# Patient Record
Sex: Male | Born: 1995
Health system: Southern US, Community
[De-identification: ages and names within clinical notes are randomized; demographics above are authoritative.]

---
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
## 2015-06-09 DIAGNOSIS — Z0000 Encounter for general adult medical examination without abnormal findings: Secondary | ICD-10-CM | POA: Diagnosis not present

## 2015-06-21 DIAGNOSIS — D729 Disorder of white blood cells, unspecified: Secondary | ICD-10-CM | POA: Diagnosis not present

## 2015-07-12 MED FILL — CHLORHEXIDINE 0.12% RINSE: 0.12 | 11 days supply | Qty: 473 | Fill #0

## 2015-09-27 ENCOUNTER — Encounter: Payer: Self-pay | Admitting: Allergy and Immunology

## 2015-09-27 ENCOUNTER — Ambulatory Visit (INDEPENDENT_AMBULATORY_CARE_PROVIDER_SITE_OTHER): Payer: 59 | Admitting: Allergy and Immunology

## 2015-09-27 DIAGNOSIS — Z889 Allergy status to unspecified drugs, medicaments and biological substances status: Secondary | ICD-10-CM | POA: Diagnosis not present

## 2015-09-27 MED ORDER — AMOXICILLIN 250 MG/5ML PO SUSR: 250.0000 mg | Freq: Three times a day (TID) | ORAL | Status: AC

## 2015-09-27 NOTE — Patient Instructions (Signed)
History of drug allergy Epicutaneous and intradermal skin tests to penicillin G and Pre-Pen were negative today at all dilutions despite a positive histamine control.  Than will return next week for open graded challenge to amoxicillin to definitively rule out the drug allergy.   Return in about 1 week (around 10/04/2015) for amoxicillin challenge.

## 2015-09-27 NOTE — Progress Notes (Signed)
New Patient Note  RE: John Wheeler MRN: [id number] DOB: [birth year] Date of Office Visit: 09/27/2015  Referring provider: Renford Dills, MD Primary care provider: Katy Apo, MD  Chief Complaint: Other (rule out PCN allergy)   History of present illness: John Wheeler is a 20 y.o. male presenting today to rule out penicillin allergy.  The patient is applying to [employer] but, based upon his mother's and sister's documented allergy to penicillin, he has been sent to [place] to rule out penicillin allergy.  His mother is not believe that he has had penicillin previously.  He has no other known drug allergies.    Assessment and plan: History of drug allergy Epicutaneous and intradermal skin tests to penicillin G and Pre-Pen were negative today at all dilutions despite a positive histamine control.  John Wheeler will return next week for open graded challenge to amoxicillin to definitively rule out the drug allergy.   Meds ordered this encounter  Medications  . amoxicillin (AMOXIL) 250 MG/5ML suspension 250 mg    USE JUST FOR DRUG ALLERGY TESTING    Diagnositics: Allergy skin testing: Negative at all dilutions to Pre-Pen and penicillin G despite a positive histamine control.    Physical examination: Blood pressure 106/60, pulse 60, temperature 98 F (36.7 C), temperature source Oral, resp. rate 16, height 5' 9.09" (1.755 m), weight 147 lb 0.8 oz (66.7 kg).  General: Alert, interactive, in no acute distress. Neck: Supple without lymphadenopathy. Lungs: Clear to auscultation without wheezing, rhonchi or rales. CV: Normal S1, S2 without murmurs. Abdomen: Nondistended, nontender. Skin: Warm and dry, without lesions or rashes. Extremities:  No clubbing, cyanosis or edema. Neuro:   Grossly intact.  Review of systems:  Review of Systems  Constitutional: Negative for chills, fever and weight loss.  HENT: Negative for nosebleeds.   Eyes: Negative for blurred vision.    Respiratory: Negative for hemoptysis.   Cardiovascular: Negative for chest pain.  Gastrointestinal: Negative for constipation and diarrhea.  Genitourinary: Negative for dysuria.  Musculoskeletal: Negative for joint pain and myalgias.  Neurological: Negative for dizziness.  Endo/Heme/Allergies: Does not bruise/bleed easily.    Past medical history:  History reviewed. No pertinent past medical history.  Past surgical history:  Past Surgical History:  Procedure Laterality Date  . APPENDECTOMY  2008    Family history: Family History  Problem Relation Age of Onset  . Allergic rhinitis Neg Hx   . Angioedema Neg Hx   . Asthma Neg Hx   . Eczema Neg Hx   . Immunodeficiency Neg Hx   . Urticaria Neg Hx     Social history: Social History   Social History  . Marital status: Single    Spouse name: N/A  . Number of children: N/A  . Years of education: N/A   Occupational History  . Not on file.   Social History Main Topics  . Smoking status: Never Smoker  . Smokeless tobacco: Never Used  . Alcohol use No  . Drug use: No  . Sexual activity: Not on file   Other Topics Concern  . Not on file   Social History Narrative  . No narrative on file   Environmental History: The patient lives in a 22 year old house with carpeting in the bedroom and central air/heat.  There is a dog in house which does not have access to his bedroom.  He is a nonsmoker.    Medication List    as of 09/27/2015  1:36 PM   You  have not been prescribed any medications.     Known medication allergies: Allergies  Allergen Reactions  . Penicillins     I appreciate the opportunity to take part in Jontay's care. Please do not hesitate to contact me with questions.  Sincerely,   R. Jorene Guest, MD

## 2015-09-27 NOTE — Assessment & Plan Note (Addendum)
Epicutaneous and intradermal skin tests to penicillin G and Pre-Pen were negative today at all dilutions despite a positive histamine control.  John Wheeler will return next week for open graded challenge to amoxicillin to definitively rule out the drug allergy.

## 2015-09-28 ENCOUNTER — Telehealth: Payer: Self-pay

## 2015-09-28 MED FILL — AMOXICILLIN 250 MG/5 ML SUS: 250 | 1 days supply | Qty: 100 | Fill #0

## 2015-09-28 NOTE — Telephone Encounter (Signed)
Mom called regarding (Amoxil) drug challenge.  Amoxil was not sent to pharmacy.  I called in Amoxil 250mg /79ml ( ) to use for in office drug challenge tomorrow with Dr. Dellis Anes in [facility].  Mom informed and she will pick it up in the morning.

## 2015-09-29 ENCOUNTER — Encounter: Payer: Self-pay | Admitting: Allergy & Immunology

## 2015-09-29 ENCOUNTER — Ambulatory Visit (INDEPENDENT_AMBULATORY_CARE_PROVIDER_SITE_OTHER): Payer: 59 | Admitting: Allergy & Immunology

## 2015-09-29 VITALS — BP 84/60 | HR 56 | Temp 97.7°F | Resp 20

## 2015-09-29 DIAGNOSIS — Z889 Allergy status to unspecified drugs, medicaments and biological substances status: Secondary | ICD-10-CM | POA: Diagnosis not present

## 2015-09-29 NOTE — Patient Instructions (Addendum)
1. You tolerated the amoxicillin dose, therefore you are officially not allergic to penicillins.  2. We have removed this from your allergy list.  3. We have printed off a letter stating that you are not allergic to penicillins.  4. For the next two days, take 57mL three time daily. If you are doing well at that point, you may stop. This is just an added precaution to make sure that you do not have any late reactions to the medication.   It was a pleasure meeting you today! Call us with any concerning symptoms you may have throughout the day, including swelling, hives, stomach pain, or anything other symptoms.

## 2015-09-29 NOTE — Progress Notes (Signed)
    John Wheeler presents today for an open challenge to penicillin. He was evaluated two days prior and underwent skin testing that was negative (see Office Note by Dr. Nunzio Cobbs). Since the visit, he reports that he has done well. He did work out after the visit and felt somewhat lightheaded, but otherwise no wheezing, pruritis, SOB, abdominal pain, urticaria, or other adverse results from the testing. Of note, he has never been exposed to penicillin and instead carries the diagnosis as a precaution due to penicillin allergies in his mother and sister. Today he brings in amoxicillin liquid (250mg /48mL).   Vitals:   09/29/15 0908  BP: (!) 84/60  Pulse: (!) 56  Resp: 20  Temp: 97.7 F (36.5 C)   **BP was confirmed three times (this was prior to the first dose)   General:  alert, active, in no acute distress, very pleasant male Head:  normocephalic, no masses, lesions, tenderness or abnormalities Eyes:  conjunctiva clear without injection or discharge, EOMI, PERL Ears:  TM's pearly white bilaterally, external auditory canals are clear, external ears are normally set and rotated Nose:  External nose within normal limits, normal appearing turbinates, no olored discharge, septum midline Throat:  moist mucous membranes without erythema, exudates or petechiae, uvula midline Lungs:  clear to auscultation, no wheezing, crackles or rhonchi, breathing unlabored, moving air well in all lung fields, moving air well in all lung fields, no retractions Heart:  regular rate and rhythm, normal S1/S2, no murmurs or gallops, normal peripheral perfusion Abdomen:  Soft, non-tender, BS normal, no masses, no organomegaly Neuro:  Normal mental status, speech normal, alert and oriented x3 Skin:  skin color, texture and turgor are normal; no bruising, rashes or lesions noted.  We will perform a 10/90 challenge today. Full dose of amoxicillin is 500mg  (58mL), therefore 10% of the dose is 39mL of thr 250mg /60mL). We  will give 54mL and wait 30 minutes. Then we will give 26mL and wait another 30 minutes. If he remains stable, we will discharge.  Vitals remained stable during the challenge (see Flowsheets). His exam did not change and he displayed no symptoms of anaphylaxis during his challenge.   Open graded penicillin challenge: The patient was able to tolerate the challenge today without adverse signs or symptoms. Vital signs were stable throughout the challenge and observation period. He was instructed to take 76mL of amoxicillin three times daily for the next two days to make sure that he did not have any delayed reactions.   The patient had negative skin tests to penicillin and was able to tolerate the open graded oral challenge today without adverse signs or symptoms. Therefore, he has the same risk of systemic reaction associated with the consumption of penicillin as the general population. A letter explaining the testing and the results was provided so that he could pass it along to [employer] during his recruitment physical.   John Wheeler was discharged with strict return precautions and indications for seeking immediate medical help. He does have access to the after-hours on-call physician.   John Bonds, MD FAAAAI [facility]

## 2015-10-06 ENCOUNTER — Telehealth: Payer: Self-pay | Admitting: Allergy & Immunology

## 2015-10-06 NOTE — Telephone Encounter (Signed)
Patient called for test results and said that this can be given over the phone to his mother at [phone] because he will be going to work this afternoon. Please call pt mom back with test results. Thanks

## 2015-10-06 NOTE — Telephone Encounter (Signed)
Left message on mom's cell # ok'd by the pt. That his penicillin skin testing and amoxicillin oral challenge was all negative and penicillin drug allergy was removed.

## 2015-10-09 ENCOUNTER — Ambulatory Visit: Payer: Self-pay | Admitting: Allergy and Immunology

## 2015-10-12 ENCOUNTER — Telehealth: Payer: Self-pay | Admitting: Allergy & Immunology

## 2015-10-13 NOTE — Telephone Encounter (Signed)
LM FOR PT TO CALL US BACK I HAVE PRINTED OFF RESULTS AND PLACED THEM UPFRONT.

## 2016-04-18 DIAGNOSIS — R41840 Attention and concentration deficit: Secondary | ICD-10-CM | POA: Diagnosis not present

## 2016-04-18 DIAGNOSIS — E559 Vitamin D deficiency, unspecified: Secondary | ICD-10-CM | POA: Diagnosis not present

## 2016-04-18 DIAGNOSIS — M8430XA Stress fracture, unspecified site, initial encounter for fracture: Secondary | ICD-10-CM | POA: Diagnosis not present

## 2016-10-02 ENCOUNTER — Encounter (HOSPITAL_BASED_OUTPATIENT_CLINIC_OR_DEPARTMENT_OTHER): Payer: Self-pay | Admitting: *Deleted

## 2016-10-02 ENCOUNTER — Emergency Department (HOSPITAL_BASED_OUTPATIENT_CLINIC_OR_DEPARTMENT_OTHER): Payer: 59

## 2016-10-02 ENCOUNTER — Emergency Department (HOSPITAL_BASED_OUTPATIENT_CLINIC_OR_DEPARTMENT_OTHER)
Admission: EM | Admit: 2016-10-02 | Discharge: 2016-10-02 | Disposition: A | Discharge status: Home / Self Care | Payer: 59 | Attending: Physician Assistant | Admitting: Physician Assistant

## 2016-10-02 DIAGNOSIS — M545 Low back pain: Secondary | ICD-10-CM | POA: Diagnosis not present

## 2016-10-02 MED ORDER — CYCLOBENZAPRINE HCL 10 MG PO TABS: 10.0000 mg | ORAL_TABLET | Freq: Two times a day (BID) | ORAL | 0 refills | Status: DC | PRN

## 2016-10-02 MED ORDER — IBUPROFEN 800 MG PO TABS: 800.0000 mg | ORAL_TABLET | Freq: Three times a day (TID) | ORAL | 0 refills | Status: DC

## 2016-10-02 NOTE — ED Notes (Signed)
Patient transported to X-ray 

## 2016-10-02 NOTE — Discharge Instructions (Signed)
Please use ibuprofen and flexeril to help your symptoms.  We think that your symptoms are likely musculoskeletal in nature. Please follow-up with her primary care physician.
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
## 2016-10-03 MED FILL — IBUPROFEN 800 MG TAB: 800 | 7 days supply | Qty: 21 | Fill #0

## 2016-10-03 MED FILL — CYCLOBENZAPRINE 10 MG TABLE: 10 | 5 days supply | Qty: 10 | Fill #0

## 2017-11-14 ENCOUNTER — Ambulatory Visit (HOSPITAL_COMMUNITY)
Admission: EM | Admit: 2017-11-14 | Discharge: 2017-11-14 | Disposition: A | Discharge status: Home / Self Care | Payer: 59 | Attending: Emergency Medicine | Admitting: Emergency Medicine

## 2017-11-14 ENCOUNTER — Encounter (HOSPITAL_COMMUNITY): Payer: Self-pay | Admitting: Emergency Medicine

## 2017-11-14 DIAGNOSIS — S0502XA Injury of conjunctiva and corneal abrasion without foreign body, left eye, initial encounter: Secondary | ICD-10-CM

## 2017-11-14 MED ORDER — OFLOXACIN 0.3 % OP SOLN: OPHTHALMIC | 0 refills | Status: DC

## 2017-11-14 MED FILL — OFLOXACIN 0.3% EYE DROPS: 0.3 | 8 days supply | Qty: 10 | Fill #0

## 2017-11-14 NOTE — ED Triage Notes (Signed)
Pt sts left eye irritation after something got into eye last night; pt still having pain and irritation

## 2017-11-14 NOTE — ED Provider Notes (Signed)
[facility]   [id number] 11/14/17 Arrival Time: [id number]  CC:EYE IRRITATION  SUBJECTIVE:  John Wheeler is a 22 y.o. male who presents with complaint of eye discomfort and irritation that began last night.  Speculates dust from a wood pallet may have gotten into his eye while at work.  Has tried washing eye out and using eye drops without relief.  Symptoms are made worse with keeping eye open for extended periods of time.  Complains of mild itching and FB sensation.  Denies eye pain, discharge, itching, vision changes, double vision.  Denies contact lens use.    ROS: As per HPI.  History reviewed. No pertinent past medical history. Past Surgical History:  Procedure Laterality Date  . APPENDECTOMY  2008   No Known Allergies Current Facility-Administered Medications on File Prior to Encounter  Medication Dose Route Frequency Provider Last Rate Last Dose  . amoxicillin (AMOXIL) 250 MG/5ML suspension 250 mg  250 mg Oral Q8H Bobbitt, Heywood Ilesalph Carter, MD       Current Outpatient Medications on File Prior to Encounter  Medication Sig Dispense Refill  . ibuprofen (ADVIL,MOTRIN) 800 MG tablet Take 1 tablet (800 mg total) by mouth 3 (three) times daily. 21 tablet 0   Social History   Socioeconomic History  . Marital status: Single    Spouse name: Not on file  . Number of children: Not on file  . Years of education: Not on file  . Highest education level: Not on file  Occupational History  . Not on file  Social Needs  . Financial resource strain: Not on file  . Food insecurity:    Worry: Not on file    Inability: Not on file  . Transportation needs:    Medical: Not on file    Non-medical: Not on file  Tobacco Use  . Smoking status: Never Smoker  . Smokeless tobacco: Never Used  Substance and Sexual Activity  . Alcohol use: No  . Drug use: No  . Sexual activity: Not on file  Lifestyle  . Physical activity:    Days per week: Not on file    Minutes per session: Not on  file  . Stress: Not on file  Relationships  . Social connections:    Talks on phone: Not on file    Gets together: Not on file    Attends religious service: Not on file    Active member of club or organization: Not on file    Attends meetings of clubs or organizations: Not on file    Relationship status: Not on file  . Intimate partner violence:    Fear of current or ex partner: Not on file    Emotionally abused: Not on file    Physically abused: Not on file    Forced sexual activity: Not on file  Other Topics Concern  . Not on file  Social History Narrative  . Not on file   Family History  Problem Relation Age of Onset  . Allergic rhinitis Neg Hx   . Angioedema Neg Hx   . Asthma Neg Hx   . Eczema Neg Hx   . Immunodeficiency Neg Hx   . Urticaria Neg Hx     OBJECTIVE:    Visual Acuity  Right Eye Distance: 20/20 without corrective lens Left Eye Distance: 20/20 without corrective lens Bilateral Distance: 20/20 without corrective lens  Right Eye Near:   Left Eye Near:    Bilateral Near:      Vitals:  11/14/17 1021  BP: 125/69  Pulse: 75  Resp: 18  Temp: 97.7 F (36.5 C)  TempSrc: Oral  SpO2: 100%    General appearance: alert; no distress Eyes:  Mild-moderate conjunctival injection. PERRL; EOMI without discomfort;  no obvious drainage; lid everted without obvious FB; diffuse fluorescein uptake, most prominent in 3 o'clock position Neck: supple Lungs: clear to auscultation bilaterally Heart: regular rate and rhythm Skin: warm and dry Psychological: alert and cooperative; normal mood and affect   Orders Placed This Encounter  Procedures  . Eye care    Please irrigate left eye    Standing Status:   Standing    Number of Occurrences:   1    ASSESSMENT & PLAN:  1. Abrasion of left cornea, initial encounter     Meds ordered this encounter  Medications  . ofloxacin (OCUFLOX) 0.3 % ophthalmic solution    Sig: instill 1 or 2 drops in affected eye(s)  every 2 to 4 hours for 2 days, then 1 to 2 drops 4 times daily on days 3 through 7    Dispense:  5 mL    Refill:  0    Order Specific Question:   Supervising Provider    Answer:   Isa Rankin [[id number]]   Eye irrigated Use ofloxacin as prescribed and to completion Use OTC systane or genteal gel eye drops at night as needed for symptomatic relief Use OTC ibuprofen or tylenol as needed for pain relief Return here or follow up with ophthamolgy if symptoms persists or worsen.    Instructed patient to follow up with ophthamology if symptoms did not continue to improve or if he experience any worsening or new symptoms such as vision changes, increased pain, increased redness, discharge, etc...  Reviewed expectations re: course of current medical issues. Questions answered. Outlined signs and symptoms indicating need for more acute intervention. Patient verbalized understanding. After Visit Summary given.   Rennis Harding, PA-C 11/14/17 1137

## 2017-11-14 NOTE — Discharge Instructions (Addendum)
Use ofloxacin as prescribed and to completion °Use OTC systane or genteal gel eye drops at night as needed for symptomatic relief °Use OTC ibuprofen or tylenol as needed for pain relief °Return here or follow up with ophthamolgy if symptoms persists or worsen  °
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
## 2018-12-03 ENCOUNTER — Ambulatory Visit: Payer: 59 | Admitting: Family Medicine

## 2019-01-14 ENCOUNTER — Encounter: Payer: Self-pay | Admitting: Family Medicine

## 2019-01-14 ENCOUNTER — Ambulatory Visit: Payer: 59 | Admitting: Family Medicine

## 2019-01-14 ENCOUNTER — Other Ambulatory Visit: Payer: Self-pay

## 2019-01-14 VITALS — BP 102/60 | HR 64 | Temp 97.6°F | Ht 70.5 in | Wt 170.6 lb

## 2019-01-14 DIAGNOSIS — F339 Major depressive disorder, recurrent, unspecified: Secondary | ICD-10-CM | POA: Diagnosis not present

## 2019-01-14 DIAGNOSIS — Z8781 Personal history of (healed) traumatic fracture: Secondary | ICD-10-CM

## 2019-01-14 DIAGNOSIS — F8081 Childhood onset fluency disorder: Secondary | ICD-10-CM | POA: Diagnosis not present

## 2019-01-14 DIAGNOSIS — G4700 Insomnia, unspecified: Secondary | ICD-10-CM

## 2019-01-14 DIAGNOSIS — K219 Gastro-esophageal reflux disease without esophagitis: Secondary | ICD-10-CM

## 2019-01-14 DIAGNOSIS — Z7689 Persons encountering health services in other specified circumstances: Secondary | ICD-10-CM | POA: Diagnosis not present

## 2019-01-14 LAB — BASIC METABOLIC PANEL
BUN: 23 mg/dL (ref 6–23)
CO2: 28 mEq/L (ref 19–32)
Calcium: 9.6 mg/dL (ref 8.4–10.5)
Chloride: 103 mEq/L (ref 96–112)
Creatinine, Ser: 0.78 mg/dL (ref 0.40–1.50)
GFR: 145.75 mL/min (ref >60.00)
Glucose, Bld: 89 mg/dL (ref 70–99)
Potassium: 4.6 mEq/L (ref 3.5–5.1)
Sodium: 137 mEq/L (ref 135–145)

## 2019-01-14 LAB — T4, FREE: Free T4: 0.72 ng/dL (ref 0.60–1.60)

## 2019-01-14 LAB — CBC
HCT: 41.3 % (ref 39.0–52.0)
Hemoglobin: 13.7 g/dL (ref 13.0–17.0)
MCHC: 33.1 g/dL (ref 30.0–36.0)
MCV: 84.6 fl (ref 78.0–100.0)
Platelets: 248 10*3/uL (ref 150.0–400.0)
RBC: 4.88 Mil/uL (ref 4.22–5.81)
RDW: 14.3 % (ref 11.5–15.5)
WBC: 3.9 10*3/uL — ABNORMAL LOW (ref 4.0–10.5)

## 2019-01-14 LAB — TSH: TSH: 2.23 u[IU]/mL (ref 0.35–4.50)

## 2019-01-14 MED ORDER — OMEPRAZOLE 20 MG PO CPDR: 20.0000 mg | DELAYED_RELEASE_CAPSULE | Freq: Every day | ORAL | 3 refills | Status: DC

## 2019-01-14 MED ORDER — HYDROXYZINE PAMOATE 50 MG PO CAPS: 50.0000 mg | ORAL_CAPSULE | Freq: Every evening | ORAL | 0 refills | Status: DC | PRN

## 2019-01-14 MED FILL — HYDROXYZINE PAM 50 MG CAP: 50 | 30 days supply | Qty: 30 | Fill #0

## 2019-01-14 MED FILL — OMEPRAZOLE 20 MG CAP: 20 | 30 days supply | Qty: 30 | Fill #0

## 2019-01-14 NOTE — Patient Instructions (Addendum)
You should contact the [facility] for counseling.  [facility]com Food Choices for Gastroesophageal Reflux Disease, Adult When you have gastroesophageal reflux disease (GERD), the foods you eat and your eating habits are very important. Choosing the right foods can help ease your discomfort. Think about working with a nutrition specialist (dietitian) to help you make good choices. What are tips for following this plan?  Meals  Choose healthy foods that are low in fat, such as fruits, vegetables, whole grains, low-fat dairy products, and lean meat, fish, and poultry.  Eat small meals often instead of 3 large meals a day. Eat your meals slowly, and in a place where you are relaxed. Avoid bending over or lying down until 2-3 hours after eating.  Avoid eating meals 2-3 hours before bed.  Avoid drinking a lot of liquid with meals.  Cook foods using methods other than frying. Bake, grill, or broil food instead.  Avoid or limit: ? Chocolate. ? Peppermint or spearmint. ? Alcohol. ? Pepper. ? Black and decaffeinated coffee. ? Black and decaffeinated tea. ? Bubbly (carbonated) soft drinks. ? Caffeinated energy drinks and soft drinks.  Limit high-fat foods such as: ? Fatty meat or fried foods. ? Whole milk, cream, butter, or ice cream. ? Nuts and nut butters. ? Pastries, donuts, and sweets made with butter or shortening.  Avoid foods that cause symptoms. These foods may be different for everyone. Common foods that cause symptoms include: ? Tomatoes. ? Oranges, lemons, and limes. ? Peppers. ? Spicy food. ? Onions and garlic. ? Vinegar. Lifestyle  Maintain a healthy weight. Ask your doctor what weight is healthy for you. If you need to lose weight, work with your doctor to do so safely.  Exercise for at least 30 minutes for 5 or more days each week, or as told by your doctor.  Wear loose-fitting clothes.  Do not smoke. If you need help quitting, ask your doctor.  Sleep with  the head of your bed higher than your feet. Use a wedge under the mattress or blocks under the bed frame to raise the head of the bed. Summary  When you have gastroesophageal reflux disease (GERD), food and lifestyle choices are very important in easing your symptoms.  Eat small meals often instead of 3 large meals a day. Eat your meals slowly, and in a place where you are relaxed.  Limit high-fat foods such as fatty meat or fried foods.  Avoid bending over or lying down until 2-3 hours after eating.  Avoid peppermint and spearmint, caffeine, alcohol, and chocolate. This information is not intended to replace advice given to you by your health care provider. Make sure you discuss any questions you have with your health care provider. Document Released: 08/20/2011 Document Revised: 06/11/2018 Document Reviewed: 03/26/2016 Elsevier Patient Education  [place].  Chronic Knee Pain, Adult Chronic knee pain is pain in one or both knees that lasts longer than 3 months. Symptoms of chronic knee pain may include swelling, stiffness, and discomfort. Age-related wear and tear (osteoarthritis) of the knee joint is the most common cause of chronic knee pain. Other possible causes include:  A long-term immune-related disease that causes inflammation of the knee (rheumatoid arthritis). This usually affects both knees.  Inflammatory arthritis, such as gout or pseudogout.  An injury to the knee that causes arthritis.  An injury to the knee that damages the ligaments. Ligaments are strong tissues that connect bones to each other.  Runner's knee or pain behind the kneecap.  Treatment for chronic knee pain depends on the cause. The main treatments for chronic knee pain are physical therapy and weight loss. This condition may also be treated with medicines, injections, a knee sleeve or brace, and by using crutches. Rest, ice, compression (pressure), and elevation (RICE) therapy may also be  recommended. Follow these instructions at home: If you have a knee sleeve or brace:   Wear it as told by your health care provider. Remove it only as told by your health care provider.  Loosen it if your toes tingle, become numb, or turn cold and blue.  Keep it clean.  If the sleeve or brace is not waterproof: ? Do not let it get wet. ? Remove it if allowed by your health care provider, or cover it with a watertight covering when you take a bath or a shower. Managing pain, stiffness, and swelling      If directed, apply heat to the affected area as often as told by your health care provider. Use the heat source that your health care provider recommends, such as a moist heat pack or a heating pad. ? If you have a removable sleeve or brace, remove it as told by your health care provider. ? Place a towel between your skin and the heat source. ? Leave the heat on for 20-30 minutes. ? Remove the heat if your skin turns bright red. This is especially important if you are unable to feel pain, heat, or cold. You may have a greater risk of getting burned.  If directed, put ice on the affected area. ? If you have a removable sleeve or brace, remove it as told by your health care provider. ? Put ice in a plastic bag. ? Place a towel between your skin and the bag. ? Leave the ice on for 20 minutes, 2-3 times a day.  Move your toes often to reduce stiffness and swelling.  Raise (elevate) the injured area above the level of your heart while you are sitting or lying down. Activity  Avoid activities where both feet leave the ground at the same time (high-impact activities). Examples are running, jumping rope, and doing jumping jacks.  Return to your normal activities as told by your health care provider. Ask your health care provider what activities are safe for you.  Follow the exercise plan that your health care provider designed for you. Your health care provider may suggest that  you: ? Avoid activities that make knee pain worse. This may require you to change your exercise routines, sport participation, or job duties. ? Wear shoes with cushioned soles. ? Avoid high-impact activities or sports that require running and sudden changes in direction. ? Do physical therapy as told by your health care provider. Physical therapy is planned to match your needs and abilities. It may include exercises for strength, flexibility, stability, and endurance. ? Do exercises that increase balance and strength, such as tai chi and yoga.  Do not use the injured limb to support your body weight until your health care provider says that you can. Use crutches, a cane, or a walker, as told by your health care provider. General instructions  Take over-the-counter and prescription medicines only as told by your health care provider.  Lose weight if you are overweight. Losing even a little weight can reduce knee pain. Ask your health care provider what your ideal weight is, and how to safely lose extra weight. A food expert (dietitian) may be able to help you  plan your meals.  Do not use any products that contain nicotine or tobacco, such as cigarettes, e-cigarettes, and chewing tobacco. These can delay healing. If you need help quitting, ask your health care provider.  Keep all follow-up visits as told by your health care provider. This is important. Contact a health care provider if:  You have knee pain that is not getting better or gets worse.  You are unable to do your physical therapy exercises due to knee pain. Get help right away if:  Your knee swells and the swelling becomes worse.  You cannot move your knee.  You have severe knee pain. Summary  Knee pain that lasts more than 3 months is considered chronic knee pain.  The main treatments for chronic knee pain are physical therapy and weight loss. You may also need to take medicines, wear a knee sleeve or brace, use crutches,  and apply ice or heat.  Losing even a little weight can reduce knee pain. Ask your health care provider what your ideal weight is, and how to safely lose extra weight. A food expert (dietitian) may be able to help you plan your meals.  Work with a physical therapist to make a safe exercise program, as told by your health care provider. This information is not intended to replace advice given to you by your health care provider. Make sure you discuss any questions you have with your health care provider. Document Released: 04/30/2018 Document Revised: 04/30/2018 Document Reviewed: 04/30/2018 Elsevier Patient Education  2020 [place].  Insomnia Insomnia is a sleep disorder that makes it difficult to fall asleep or stay asleep. Insomnia can cause fatigue, low energy, difficulty concentrating, mood swings, and poor performance at work or school. There are three different ways to classify insomnia:  Difficulty falling asleep.  Difficulty staying asleep.  Waking up too early in the morning. Any type of insomnia can be long-term (chronic) or short-term (acute). Both are common. Short-term insomnia usually lasts for three months or less. Chronic insomnia occurs at least three times a week for longer than three months. What are the causes? Insomnia may be caused by another condition, situation, or substance, such as:  Anxiety.  Certain medicines.  Gastroesophageal reflux disease (GERD) or other gastrointestinal conditions.  Asthma or other breathing conditions.  Restless legs syndrome, sleep apnea, or other sleep disorders.  Chronic pain.  Menopause.  Stroke.  Abuse of alcohol, tobacco, or illegal drugs.  Mental health conditions, such as depression.  Caffeine.  Neurological disorders, such as Alzheimer's disease.  An overactive thyroid (hyperthyroidism). Sometimes, the cause of insomnia may not be known. What increases the risk? Risk factors for insomnia  include:  Gender. Women are affected more often than men.  Age. Insomnia is more common as you get older.  Stress.  Lack of exercise.  Irregular work schedule or working night shifts.  Traveling between different time zones.  Certain medical and mental health conditions. What are the signs or symptoms? If you have insomnia, the main symptom is having trouble falling asleep or having trouble staying asleep. This may lead to other symptoms, such as:  Feeling fatigued or having low energy.  Feeling nervous about going to sleep.  Not feeling rested in the morning.  Having trouble concentrating.  Feeling irritable, anxious, or depressed. How is this diagnosed? This condition may be diagnosed based on:  Your symptoms and medical history. Your health care provider may ask about: ? Your sleep habits. ? Any medical conditions you  have. ? Your mental health.  A physical exam. How is this treated? Treatment for insomnia depends on the cause. Treatment may focus on treating an underlying condition that is causing insomnia. Treatment may also include:  Medicines to help you sleep.  Counseling or therapy.  Lifestyle adjustments to help you sleep better. Follow these instructions at home: Eating and drinking   Limit or avoid alcohol, caffeinated beverages, and cigarettes, especially close to bedtime. These can disrupt your sleep.  Do not eat a large meal or eat spicy foods right before bedtime. This can lead to digestive discomfort that can make it hard for you to sleep. Sleep habits   Keep a sleep diary to help you and your health care provider figure out what could be causing your insomnia. Write down: ? When you sleep. ? When you wake up during the night. ? How well you sleep. ? How rested you feel the next day. ? Any side effects of medicines you are taking. ? What you eat and drink.  Make your bedroom a dark, comfortable place where it is easy to fall  asleep. ? Put up shades or blackout curtains to block light from outside. ? Use a white noise machine to block noise. ? Keep the temperature cool.  Limit screen use before bedtime. This includes: ? Watching TV. ? Using your smartphone, tablet, or computer.  Stick to a routine that includes going to bed and waking up at the same times every day and night. This can help you fall asleep faster. Consider making a quiet activity, such as reading, part of your nighttime routine.  Try to avoid taking naps during the day so that you sleep better at night.  Get out of bed if you are still awake after 15 minutes of trying to sleep. Keep the lights down, but try reading or doing a quiet activity. When you feel sleepy, go back to bed. General instructions  Take over-the-counter and prescription medicines only as told by your health care provider.  Exercise regularly, as told by your health care provider. Avoid exercise starting several hours before bedtime.  Use relaxation techniques to manage stress. Ask your health care provider to suggest some techniques that may work well for you. These may include: ? Breathing exercises. ? Routines to release muscle tension. ? Visualizing peaceful scenes.  Make sure that you drive carefully. Avoid driving if you feel very sleepy.  Keep all follow-up visits as told by your health care provider. This is important. Contact a health care provider if:  You are tired throughout the day.  You have trouble in your daily routine due to sleepiness.  You continue to have sleep problems, or your sleep problems get worse. Get help right away if:  You have serious thoughts about hurting yourself or someone else. If you ever feel like you may hurt yourself or others, or have thoughts about taking your own life, get help right away. You can go to your nearest emergency department or call:  Your local emergency services (911 in the [place]  A suicide crisis  helpline, such as the [place] at (506)637-7225. This is open 24 hours a day. Summary  Insomnia is a sleep disorder that makes it difficult to fall asleep or stay asleep.  Insomnia can be long-term (chronic) or short-term (acute).  Treatment for insomnia depends on the cause. Treatment may focus on treating an underlying condition that is causing insomnia.  Keep a sleep diary to help  you and your health care provider figure out what could be causing your insomnia. This information is not intended to replace advice given to you by your health care provider. Make sure you discuss any questions you have with your health care provider. Document Released: 02/16/2000 Document Revised: 01/31/2017 Document Reviewed: 11/28/2016 Elsevier Patient Education  2020 [employer].

## 2019-01-14 NOTE — Progress Notes (Signed)
Patient presents to clinic today to follow-up on chronic issues and establish care.  SUBJECTIVE: PMH: Pt is a 23 yo male with pmh sig h/o asthma.  Pt previously seen at [facility] by Dr. Delfina Redwood.  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 -Pt states multiple xrays were done and he was informed he had fxs in both lower legs. -Pt went to PT -endorses intermittent pain in b/l shins and R patella  -pt unable to stand for long periods of time 2/2 pain -at times knees "go out" and he has catch himself. -pt in the process of getting [facility] services.  Stutter: -started after medically d/c from the [employer] -noticeable to others -pt endorse difficulty getting words out at times  Insomnia: -also noticed after d/c from the [employer] -wakes up frequently through the night -goes to bed at 9 or 10 pm, up by 6:30 am -denies mind racing or caffeine use.  Denies family h/o thyroid dysfunction -looks at tv and phone prior bed -tried melatonin, benadryl, and lavender -pt endorses increased stress dealing with family (parents divorced and remarrried).  Also notes some stress as life plans have not gone how he thought they would. -endorses "putting on a happy face" for others. -denies SI/HI  Hoarse voice: -notices first thing in am when wakes up -continues through the day.  Voice may go out  -endorses h/o acid reflux.  May take generic tums -eating a lot of pizza, having some wine -eats dinner late, then gets in bed.  Allergies: NKDA  Past Surg hx: -appendectomy 2008 -tooth extraction and wisdom tooth extraction  Social hx: Pt is single.  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  Pt endorses social EtOH use.  Pt denies tobacco and drug use.  Family hx: Mom-HTN Dad-HTN PGF-Alzheimer's dz. Maternal Uncles x 2 Alzheimer's dz  Health Maintenance: Dental -- Dr.  Clifton James Immunizations -- influenza vaccine 2019  History reviewed. No pertinent past medical history.  Past Surgical History:  Procedure Laterality Date  . APPENDECTOMY  2008    Current Outpatient Medications on File Prior to Visit  Medication Sig Dispense Refill  . ibuprofen (ADVIL,MOTRIN) 800 MG tablet Take 1 tablet (800 mg total) by mouth 3 (three) times daily. 21 tablet 0   Current Facility-Administered Medications on File Prior to Visit  Medication Dose Route Frequency Provider Last Rate Last Dose  . amoxicillin (AMOXIL) 250 MG/5ML suspension 250 mg  250 mg Oral Q8H Bobbitt, [place] Muta, MD        No Known Allergies  Family History  Problem Relation Age of Onset  . Allergic rhinitis Neg Hx   . Angioedema Neg Hx   . Asthma Neg Hx   . Eczema Neg Hx   . Immunodeficiency Neg Hx   . Urticaria Neg Hx     Social History   Socioeconomic History  . Marital status: Single    Spouse name: Not on file  . Number of children: Not on file  . Years of education: Not on file  . Highest education level: Not on file  Occupational History  . Not on file  Social Needs  . Financial resource strain: Not on file  . Food insecurity    Worry: Not on file    Inability: Not on file  . Transportation needs    Medical: Not on file  Non-medical: Not on file  Tobacco Use  . Smoking status: Never Smoker  . Smokeless tobacco: Never Used  Substance and Sexual Activity  . Alcohol use: No  . Drug use: No  . Sexual activity: Not on file  Lifestyle  . Physical activity    Days per week: Not on file    Minutes per session: Not on file  . Stress: Not on file  Relationships  . Social Musicianconnections    Talks on phone: Not on file    Gets together: Not on file    Attends religious service: Not on file    Active member of club or organization: Not on file    Attends meetings of clubs or organizations: Not on file    Relationship status: Not on file  . Intimate partner violence    Fear  of current or ex partner: Not on file    Emotionally abused: Not on file    Physically abused: Not on file    Forced sexual activity: Not on file  Other Topics Concern  . Not on file  Social History Narrative  . Not on file    ROS General: Denies fever, chills, night sweats, changes in weight, changes in appetite  +insomnia, stutter HEENT: Denies headaches, ear pain, changes in vision, rhinorrhea, sore throat  +hoarse voice CV: Denies CP, palpitations, SOB, orthopnea Pulm: Denies SOB, cough, wheezing GI: Denies abdominal pain, nausea, vomiting, diarrhea, constipation GU: Denies dysuria, hematuria, frequency, vaginal discharge Msk: Denies muscle cramps, joint pains  +b/l LE pain, h/o fx in b/l LEs Neuro: Denies weakness, numbness, tingling Skin: Denies rashes, bruising Psych: Denies depression, anxiety, hallucinations   BP 102/60 (BP Location: Right Arm, Patient Position: Sitting, Cuff Size: Normal)   Pulse 64   Temp 97.6 F (36.4 C) (Temporal)   Ht 5' 10.5" (1.791 m)   Wt 170 lb 9.6 oz (77.4 kg)   SpO2 98%   BMI 24.13 kg/m   Physical Exam Gen. Pleasant, well developed, well-nourished, in NAD HEENT - [place]/AT, PERRL, no scleral icterus, no nasal drainage, pharynx without erythema or exudate. Lungs: no use of accessory muscles, CTAB, no wheezes, rales or rhonchi Cardiovascular: RRR, No r/g/m, no peripheral edema Musculoskeletal: TTP of b/l joint lines, and patellar tendon at tibial plateau. No crepitus.  Pain at tibial plateau with flexion and extension of leg at knee b/l.  No deformities, moves all four extremities, no cyanosis or clubbing, normal tone Neuro:  A&Ox3, CN II-XII intact, normal gait Skin:  Warm, dry, intact, no lesions  No results found for this or any previous visit (from the past 2160 hour(s)).  Assessment/Plan: History of fracture of lower extremity  -We will attempt to obtain records -Patient wishes to wait on imaging at this time. -Discussed supportive  care for likely arthritis. - Plan: Ambulatory referral to Orthopedic Surgery  Stutter -Consider speech therapy and psychiatry as symptoms started after medical discharge from [facility].  Insomnia, unspecified type -Discussed sleep hygiene -Given handout -We will try hydroxyzine 50 mg nightly as needed  - Plan: hydrOXYzine (VISTARIL) 50 MG capsule, TSH, T4, Free  Depression -PHQ-9 score 18 -Patient wishes to wait on medication at this time -Discussed counseling.  Patient advised to set up an appointment to the area provider.  Patient given information on area practices. -We will continue to evaluate at each with me. -Given precautions  Gastroesophageal reflux disease, unspecified whether esophagitis present -Given a list of foods to avoid as known to cause symptoms -We  will start Prilosec 20 mg daily to see if hoarse voice improves.  - Plan: omeprazole (PRILOSEC) 20 MG capsule, CBC (no diff), Basic Metabolic Panel  Encounter to establish care -We reviewed the PMH, PSH, FH, SH, Meds and Allergies. -We provided refills for any medications we will prescribe as needed. -We addressed current concerns per orders and patient instructions. -We have asked for records for pertinent exams, studies, vaccines and notes from previous providers. -We have advised patient to follow up per instructions below.  Patient advised to reach out to the [place] in regards to obtaining benefits and records.  F/u in 1 month  Abbe Amsterdam, MD

## 2019-01-15 ENCOUNTER — Encounter: Payer: Self-pay | Admitting: Family Medicine

## 2019-01-15 DIAGNOSIS — G4700 Insomnia, unspecified: Secondary | ICD-10-CM | POA: Insufficient documentation

## 2019-01-15 DIAGNOSIS — K219 Gastro-esophageal reflux disease without esophagitis: Secondary | ICD-10-CM | POA: Insufficient documentation

## 2019-01-15 DIAGNOSIS — F8081 Childhood onset fluency disorder: Secondary | ICD-10-CM | POA: Insufficient documentation

## 2019-01-15 DIAGNOSIS — F339 Major depressive disorder, recurrent, unspecified: Secondary | ICD-10-CM | POA: Insufficient documentation

## 2019-01-15 DIAGNOSIS — Z8781 Personal history of (healed) traumatic fracture: Secondary | ICD-10-CM | POA: Insufficient documentation

## 2019-01-20 ENCOUNTER — Ambulatory Visit: Payer: Self-pay

## 2019-01-20 ENCOUNTER — Other Ambulatory Visit: Payer: Self-pay

## 2019-01-20 ENCOUNTER — Encounter: Payer: Self-pay | Admitting: Orthopaedic Surgery

## 2019-01-20 ENCOUNTER — Ambulatory Visit (INDEPENDENT_AMBULATORY_CARE_PROVIDER_SITE_OTHER): Payer: 59

## 2019-01-20 ENCOUNTER — Ambulatory Visit (INDEPENDENT_AMBULATORY_CARE_PROVIDER_SITE_OTHER): Payer: 59 | Admitting: Orthopaedic Surgery

## 2019-01-20 VITALS — Ht 70.5 in | Wt 170.0 lb

## 2019-01-20 DIAGNOSIS — M25561 Pain in right knee: Secondary | ICD-10-CM | POA: Diagnosis not present

## 2019-01-20 DIAGNOSIS — M25562 Pain in left knee: Secondary | ICD-10-CM

## 2019-01-20 MED ORDER — DICLOFENAC SODIUM 1 % EX GEL
2.0000 g | Freq: Four times a day (QID) | CUTANEOUS | 1 refills | Status: DC
Start: 2019-01-20 — End: 2020-04-10

## 2019-01-20 MED ORDER — MELOXICAM 7.5 MG PO TABS: ORAL_TABLET | ORAL | 2 refills | Status: DC

## 2019-01-20 MED FILL — DICLOFENAC SODIUM 1 % GEL: 1 | 13 days supply | Qty: 100 | Fill #0

## 2019-01-20 MED FILL — MELOXICAM 7.5 MG TABLET: 7.5 | 30 days supply | Qty: 30 | Fill #0

## 2019-01-20 NOTE — Progress Notes (Signed)
Office Visit Note   Patient: John Wheeler           Date of Birth: [birth year]           MRN: [id number] Visit Date: 01/20/2019              Requested by: Billie Ruddy, MD [place],  [place] [address] PCP: Billie Ruddy, MD   Assessment & Plan: Visit Diagnoses:  1. Pain in both knees, unspecified chronicity     Plan: Impression is chronic bilateral knee and shin pain.  We will obtain an autoimmune panel at this point in time.  We will call him with the results.  We will also start him on oral and topical anti-inflammatories and send him to formal physical therapy.  He will follow-up with [place] in 2 months time for recheck.  Call with concerns or questions in the meantime.  Follow-Up Instructions: Return in about 2 months (around 03/22/2019).   Orders:  Orders Placed This Encounter  Procedures  . XR KNEE 3 VIEW LEFT  . XR KNEE 3 VIEW RIGHT  . Antinuclear Antib (ANA)  . Rheumatoid Factor  . Uric acid  . Sed Rate (ESR)  . Ambulatory referral to Physical Therapy   Meds ordered this encounter  Medications  . diclofenac Sodium (VOLTAREN) 1 % GEL    Sig: Apply 2 g topically 4 (four) times daily.    Dispense:  150 g    Refill:  1  . meloxicam (MOBIC) 7.5 MG tablet    Sig: Take once daily x 2 weeks and then prn    Dispense:  30 tablet    Refill:  2      Procedures: No procedures performed   Clinical Data: No additional findings.   Subjective: Chief Complaint  Patient presents with  . Right Leg - Pain  . Left Leg - Pain    HPI patient is a 23 year old gentleman who presents our clinic today with chronic bilateral knee and shin pain.  This began approximately 3 years ago following basic training for the [employer].  He notes that he had bilateral knee and shin stress fractures worse on the right than the left.  He was told to not put weight on his legs but says that he was forced to live on the third floor of an apartment and had no  choice to put weight on his legs.  He was given narcotic pain medication and sent to physical therapy.  He notes that the pain medication Mast his pain and wants the meds were off his pain returned.  All of this pain never really resolved over the past few years.  He comes in today for further evaluation and treatment recommendation.  The pain he has is to the entire aspect of both knees and into the mid tibia.  Worse when he is standing or walking a lot as well as with cold weather.  He gets mild relief when he is moving his legs around.  He also notes femoral stiffness if he sits for too long period of time.  He has been taking ibuprofen with minimal relief of symptoms.  He also notes that his legs occasionally fall asleep.  No family history of autoimmune disease.  Review of Systems as detailed in HPI.  All others reviewed and are negative.   Objective: Vital Signs: Ht 5' 10.5" (1.791 m)   Wt 170 lb (77.1 kg)   BMI 24.05 kg/m  Physical Exam well-developed well-nourished gentleman in no acute distress.  Alert and oriented x3. Ortho Exam examination of both knees reveals no effusion.  Mild tenderness medial joint line bilaterally and lateral joint line on the right.  No patellofemoral crepitus.  Range of motion 0 to 125 degrees.  Cruciates and collaterals are stable.  He does have mild to moderate tenderness to the proximal half of the tibia.  No 1 area of point tenderness.  He is neurovascularly intact distally.   Specialty Comments:  No specialty comments available.  Imaging: Xr Knee 3 View Left  Result Date: 01/20/2019 No acute or structural abnormalities   Xr Knee 3 View Right  Result Date: 01/20/2019 No acute or structural abnormalities    PMFS History: Patient Active Problem List   Diagnosis Date Noted  . History of fracture of lower extremity 01/15/2019  . Stutter 01/15/2019  . Insomnia 01/15/2019  . Gastroesophageal reflux disease 01/15/2019  . Depression, recurrent  ([place]) 01/15/2019  . History of drug allergy 09/27/2015   No past medical history on file.  Family History  Problem Relation Age of Onset  . Allergic rhinitis Neg Hx   . Angioedema Neg Hx   . Asthma Neg Hx   . Eczema Neg Hx   . Immunodeficiency Neg Hx   . Urticaria Neg Hx     Past Surgical History:  Procedure Laterality Date  . APPENDECTOMY  2008   Social History   Occupational History  . Not on file  Tobacco Use  . Smoking status: Never Smoker  . Smokeless tobacco: Never Used  Substance and Sexual Activity  . Alcohol use: No  . Drug use: No  . Sexual activity: Not on file

## 2019-01-23 LAB — SEDIMENTATION RATE: Sed Rate: 9 mm/h (ref 0–15)

## 2019-01-23 LAB — ANA: Anti Nuclear Antibody (ANA): POSITIVE — AB

## 2019-01-23 LAB — ANTI-NUCLEAR AB-TITER (ANA TITER): ANA Titer 1: 1:80 {titer} — ABNORMAL HIGH

## 2019-01-23 LAB — RHEUMATOID FACTOR: Rhuematoid fact SerPl-aCnc: <14 IU/mL (ref <14)

## 2019-01-23 LAB — URIC ACID: Uric Acid, Serum: 4.6 mg/dL (ref 4.0–8.0)

## 2019-01-25 ENCOUNTER — Other Ambulatory Visit: Payer: Self-pay

## 2019-01-25 DIAGNOSIS — R768 Other specified abnormal immunological findings in serum: Secondary | ICD-10-CM

## 2019-01-25 NOTE — Progress Notes (Signed)
Refer to deveshwar

## 2019-02-15 ENCOUNTER — Telehealth (INDEPENDENT_AMBULATORY_CARE_PROVIDER_SITE_OTHER): Payer: 59 | Admitting: Family Medicine

## 2019-02-15 DIAGNOSIS — B351 Tinea unguium: Secondary | ICD-10-CM

## 2019-02-15 DIAGNOSIS — Z8781 Personal history of (healed) traumatic fracture: Secondary | ICD-10-CM | POA: Diagnosis not present

## 2019-02-15 DIAGNOSIS — F339 Major depressive disorder, recurrent, unspecified: Secondary | ICD-10-CM

## 2019-02-15 DIAGNOSIS — K219 Gastro-esophageal reflux disease without esophagitis: Secondary | ICD-10-CM | POA: Diagnosis not present

## 2019-02-15 NOTE — Progress Notes (Signed)
Virtual Visit via Video Note  I connected with John Wheeler on 02/15/19 at 10:00 AM EST by a video enabled telemedicine application 2/2 COVID-19 pandemic and verified that I am speaking with the correct person using two identifiers.  Location patient: home Location provider:work or home office Persons participating in the virtual visit: patient, provider  I discussed the limitations of evaluation and management by telemedicine and the availability of in person appointments. The patient expressed understanding and agreed to proceed.   HPI: Pt is a 23 yo male with pmh sig for depression, chronic LE pain s/p fx, hoarseness, GERD, stutter, and insomnia.  Pt seen today for f/u.  Pt taking omperazole 20 mg daily for GERD.  Notes some improvement in hoarse voice.  Pt has been forgetting to take the med daily before meals.  Notes feeling "dry" on hydroxyzine 50 mg in the am.  Now able to sleep.  Pt found counseling not to be helpful after 3 sessions.  Was seeing John Wheeler.  Pt states he was then advised contact a Psychiatrist, John Wheeler, however she was no longer at the office he was referred to.  Pt states he is irritable as it is early in the am. Pt states he feels indifferent, tired this am, and has difficulty with concentration.  Pt seen by Ortho.  Had xrays of LE/knees which were negative.  Pt's knees are still giving out.  Pt advised to start PT.  Next appt with Ortho is in Feb.  Pt states he cannot make that appt as his insurance runs out at the end of the month.  Pt has yet to reach out to the [place] in regards to his records and benefits.  Pt notes discoloration of several toenails x the last few months.  R great toe and others are thick, yellow, with whitish spots.  Pt denies itching or burning of feet.  ROS: See pertinent positives and negatives per HPI.  No past medical history on file.  Past Surgical History:  Procedure Laterality Date  . APPENDECTOMY  2008    Family History   Problem Relation Age of Onset  . Allergic rhinitis Neg Hx   . Angioedema Neg Hx   . Asthma Neg Hx   . Eczema Neg Hx   . Immunodeficiency Neg Hx   . Urticaria Neg Hx      Current Outpatient Medications:  .  diclofenac Sodium (VOLTAREN) 1 % GEL, Apply 2 g topically 4 (four) times daily., Disp: 150 g, Rfl: 1 .  hydrOXYzine (VISTARIL) 50 MG capsule, Take 1 capsule (50 mg total) by mouth at bedtime as needed., Disp: 30 capsule, Rfl: 0 .  ibuprofen (ADVIL,MOTRIN) 800 MG tablet, Take 1 tablet (800 mg total) by mouth 3 (three) times daily., Disp: 21 tablet, Rfl: 0 .  meloxicam (MOBIC) 7.5 MG tablet, Take once daily x 2 weeks and then prn, Disp: 30 tablet, Rfl: 2 .  omeprazole (PRILOSEC) 20 MG capsule, Take 1 capsule (20 mg total) by mouth daily., Disp: 30 capsule, Rfl: 3  Current Facility-Administered Medications:  .  amoxicillin (AMOXIL) 250 MG/5ML suspension 250 mg, 250 mg, Oral, Q8H, Bobbitt, Heywood Iles, MD  EXAMTheodoro Kos per patient if applicable:  RR between 12-20 bpm  GENERAL: alert, oriented, appears well and in no acute distress  HEENT: atraumatic, conjunctiva clear, no obvious abnormalities on inspection of external nose and ears  NECK: normal movements of the head and neck  LUNGS: on inspection no signs of respiratory distress,  breathing rate appears normal, no obvious gross SOB, gasping or wheezing  CV: no obvious cyanosis  SKIN:  Toenails of b/l feet mildly hypertrophic with a yellow/white discoloration.  MS: moves all visible extremities without noticeable abnormality  PSYCH/NEURO: pleasant and cooperative, no obvious depression or anxiety, speech and thought processing grossly intact  ASSESSMENT AND PLAN:  Discussed the following assessment and plan:  Depression, recurrent (HCC) -PHQ 9 score 18 on 01/14/2019 -continue hydroxyzine 50 mg qhs for insomnia -pt to consider starting medication for depression such as an SSRI -pt advised to contact another  Psychiatrist office for counseling/med management. -given precautions -f/u in 1 month, sooner if needed.  Onychomycosis -discussed OTC treatment options -pt advised treatment likely to take several months to 1 yr before complete resolution noted. -discussed po options and risk a/w them -will continue to monitor.  Gastroesophageal reflux disease, unspecified whether esophagitis present -continue omeprazole 20 mg daily  -avoid foods known to cause problems -will continue to monitor  History of fracture of lower extremity -pain possibly 2/2 arthritis s/p fx of b/l LEs -b/l knee xrays done 01/20/19 with [facility] -pt advised to f/u with Ortho, Dr. Erlinda Hong.  Per Ortho note, autoimmune panel obtained, started on po and topical anti-inflammatories, and sent to PT.  ANA was positive, SED rate normal. -pt encouraged to continue reaching out to the [place] in regards to obtaining records and benefits.  F/u in 1 month  I discussed the assessment and treatment plan with the patient. The patient was provided an opportunity to ask questions and all were answered. The patient agreed with the plan and demonstrated an understanding of the instructions.   The patient was advised to call back or seek an in-person evaluation if the symptoms worsen or if the condition fails to improve as anticipated.  Billie Ruddy, MD   This note is not being shared with the patient for the following reason: To prevent harm (release of this note would result in harm to the life or physical safety of the patient or another).

## 2019-03-23 ENCOUNTER — Ambulatory Visit: Payer: 59 | Admitting: Orthopaedic Surgery

## 2019-06-11 ENCOUNTER — Ambulatory Visit: Payer: Self-pay | Attending: Internal Medicine

## 2019-06-11 DIAGNOSIS — Z23 Encounter for immunization: Secondary | ICD-10-CM

## 2019-06-11 NOTE — Progress Notes (Signed)
   Covid-19 Vaccination Clinic  Name:  Teng Decou    MRN: [id number] DOB: 1995-06-13  06/11/2019  Mr. Semel was observed post Covid-19 immunization for 15 minutes without incident. He was provided with Vaccine Information Sheet and instruction to access the V-Safe system.   Mr. Brigham was instructed to call 911 with any severe reactions post vaccine: Marland Kitchen Difficulty breathing  . Swelling of face and throat  . A fast heartbeat  . A bad rash all over body  . Dizziness and weakness   Immunizations Administered    Name Date Dose VIS Date Route   Pfizer COVID-19 Vaccine 06/11/2019  1:10 PM 0.3 mL 02/12/2019 Intramuscular   Manufacturer: [employer], [employer]   Lot: [id number]   NDC: [address]-7

## 2019-07-05 ENCOUNTER — Ambulatory Visit: Payer: Self-pay

## 2020-04-10 ENCOUNTER — Encounter: Payer: Self-pay | Admitting: Family Medicine

## 2020-04-10 ENCOUNTER — Other Ambulatory Visit: Payer: Self-pay

## 2020-04-10 ENCOUNTER — Other Ambulatory Visit (HOSPITAL_COMMUNITY): Payer: Self-pay | Admitting: Family Medicine

## 2020-04-10 ENCOUNTER — Ambulatory Visit (INDEPENDENT_AMBULATORY_CARE_PROVIDER_SITE_OTHER): Payer: No Typology Code available for payment source | Admitting: Family Medicine

## 2020-04-10 VITALS — BP 108/70 | HR 72 | Temp 98.1°F | Ht 70.5 in | Wt 204.0 lb

## 2020-04-10 DIAGNOSIS — K219 Gastro-esophageal reflux disease without esophagitis: Secondary | ICD-10-CM | POA: Diagnosis not present

## 2020-04-10 DIAGNOSIS — R6884 Jaw pain: Secondary | ICD-10-CM

## 2020-04-10 DIAGNOSIS — R252 Cramp and spasm: Secondary | ICD-10-CM | POA: Diagnosis not present

## 2020-04-10 DIAGNOSIS — Z0000 Encounter for general adult medical examination without abnormal findings: Secondary | ICD-10-CM

## 2020-04-10 LAB — CBC WITH DIFFERENTIAL/PLATELET
Basophils Absolute: 0 10*3/uL (ref 0.0–0.1)
Basophils Relative: 0.5 % (ref 0.0–3.0)
Eosinophils Absolute: 0 10*3/uL (ref 0.0–0.7)
Eosinophils Relative: 1.2 % (ref 0.0–5.0)
HCT: 39.8 % (ref 39.0–52.0)
Hemoglobin: 13.3 g/dL (ref 13.0–17.0)
Lymphocytes Relative: 38.5 % (ref 12.0–46.0)
Lymphs Abs: 1.4 10*3/uL (ref 0.7–4.0)
MCHC: 33.3 g/dL (ref 30.0–36.0)
MCV: 82.6 fl (ref 78.0–100.0)
Monocytes Absolute: 0.4 10*3/uL (ref 0.1–1.0)
Monocytes Relative: 9.9 % (ref 3.0–12.0)
Neutro Abs: 1.9 10*3/uL (ref 1.4–7.7)
Neutrophils Relative %: 49.9 % (ref 43.0–77.0)
Platelets: 244 10*3/uL (ref 150.0–400.0)
RBC: 4.82 Mil/uL (ref 4.22–5.81)
RDW: 14.2 % (ref 11.5–15.5)
WBC: 3.7 10*3/uL — ABNORMAL LOW (ref 4.0–10.5)

## 2020-04-10 LAB — BASIC METABOLIC PANEL
BUN: 11 mg/dL (ref 6–23)
CO2: 27 mEq/L (ref 19–32)
Calcium: 9.5 mg/dL (ref 8.4–10.5)
Chloride: 105 mEq/L (ref 96–112)
Creatinine, Ser: 0.77 mg/dL (ref 0.40–1.50)
GFR: 123.06 mL/min (ref >60.00)
Glucose, Bld: 96 mg/dL (ref 70–99)
Potassium: 4.4 mEq/L (ref 3.5–5.1)
Sodium: 139 mEq/L (ref 135–145)

## 2020-04-10 LAB — TSH: TSH: 4.01 u[IU]/mL (ref 0.35–4.50)

## 2020-04-10 LAB — HEMOGLOBIN A1C: Hgb A1c MFr Bld: 5.9 % (ref 4.6–6.5)

## 2020-04-10 LAB — LIPID PANEL
Cholesterol: 140 mg/dL (ref 0–200)
HDL: 37.6 mg/dL — ABNORMAL LOW (ref >39.00)
LDL Cholesterol: 93 mg/dL (ref 0–99)
NonHDL: 102.43
Total CHOL/HDL Ratio: 4
Triglycerides: 49 mg/dL (ref 0.0–149.0)
VLDL: 9.8 mg/dL (ref 0.0–40.0)

## 2020-04-10 LAB — T4, FREE: Free T4: 0.72 ng/dL (ref 0.60–1.60)

## 2020-04-10 MED ORDER — OMEPRAZOLE 20 MG PO CPDR: 20.0000 mg | DELAYED_RELEASE_CAPSULE | Freq: Every day | ORAL | 3 refills | Status: AC

## 2020-04-10 MED FILL — OMEPRAZOLE 20 MG CAP: 20 | 30 days supply | Qty: 30 | Fill #0

## 2020-04-10 NOTE — Progress Notes (Signed)
Subjective:     Jaron Czarnecki II is a 25 y.o. male and is here for a comprehensive physical exam.  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  Pt notes bump on dorsum on R wrist that was present when wrist flexed.  Area was tender, but has not resolved.  Pt having intermittent jaw pain/cramp and cramping in R ankle.  Will stretch ankle like a charlie horse.  Denies grinding teeth or chewing on really hard items.  Will occasionally chew on ice.  Pt with increased Acid reflux symptoms.  Was taking prilosec which helped.  Notes symptoms with spaghetti sauce.  Social History   Socioeconomic History  . Marital status: Single    Spouse name: Not on file  . Number of children: Not on file  . Years of education: Not on file  . Highest education level: Not on file  Occupational History  . Not on file  Tobacco Use  . Smoking status: Never Smoker  . Smokeless tobacco: Never Used  Substance and Sexual Activity  . Alcohol use: No  . Drug use: No  . Sexual activity: Not on file  Other Topics Concern  . Not on file  Social History Narrative  . Not on file   Social Determinants of Health   Financial Resource Strain: Not on file  Food Insecurity: Not on file  Transportation Needs: Not on file  Physical Activity: Not on file  Stress: Not on file  Social Connections: Not on file  Intimate Partner Violence: Not on file   Health Maintenance  Topic Date Due  . Hepatitis C Screening  Never done  . HIV Screening  Never done  . COVID-19 Vaccine (3 - Booster for Pfizer series) 04/14/2020  . INFLUENZA VACCINE  06/01/2021 (Originally 10/03/2019)  . TETANUS/TDAP  10/16/2021    The following portions of the patient's history were reviewed and updated as appropriate: allergies, current medications, past family history, past medical history, past social history, past surgical history and problem list.  Review of Systems Pertinent items  noted in HPI and remainder of comprehensive ROS otherwise negative.   Objective:    BP 108/70 (BP Location: Left Arm, Patient Position: Sitting, Cuff Size: Normal)   Pulse 72   Temp 98.1 F (36.7 C) (Oral)   Ht 5' 10.5" (1.791 m)   Wt 204 lb (92.5 kg)   BMI 28.86 kg/m  General appearance: alert, cooperative and no distress Head: Normocephalic, without obvious abnormality, atraumatic Eyes: conjunctivae/corneas clear. PERRL, EOM's intact. Fundi benign. Ears: normal TM's and external ear canals both ears Nose: Nares normal. Septum midline. Mucosa normal. No drainage or sinus tenderness. Throat: lips, mucosa, and tongue normal; teeth and gums normal Neck: no adenopathy, no carotid bruit, no JVD, supple, symmetrical, trachea midline and thyroid not enlarged, symmetric, no tenderness/mass/nodules Lungs: clear to auscultation bilaterally Heart: regular rate and rhythm, S1, S2 normal, no murmur, click, rub or gallop Abdomen: soft, non-tender; bowel sounds normal; no masses,  no organomegaly Extremities: extremities normal, atraumatic, no cyanosis or edema Pulses: 2+ and symmetric Skin: Skin color, texture, turgor normal. No rashes or lesions Lymph nodes: Cervical, supraclavicular, and axillary nodes normal. Neurologic: Alert and oriented X 3, normal strength and tone. Normal symmetric reflexes. Normal coordination and gait    Assessment:    Healthy male exam with increased GERD sx.      Plan:     Anticipatory guidance given including wearing  seatbelts, smoke detectors in the home, increasing physical activity, increasing p.o. intake of water and vegetables. -will obtain labs -offered STI testing, declines at this time -reviewed immunizations -given handout -next CPE in 1 yr See After Visit Summary for Counseling Recommendations    Gastroesophageal reflux disease, unspecified whether esophagitis present -Discussed possible causes including hiatal hernia -Discussed avoiding foods  known to cause symptoms -Continue Prilosec -For continued or worsening symptoms consider follow-up with GI and evaluation for hiatal hernia - Plan: omeprazole (PRILOSEC) 20 MG capsule  Jaw pain -Advised symptoms possibly caused by muscle strain, grinding teeth, TMJ dysfunction -Discussed supportive care including Tylenol or NSAIDs as needed -Patient advised to avoid hard, crunchy foods -Per chart review h/o positive ANA, nucleolar, speckled pattern 01/20/19.  Consider repeat testing and f/u with Rheumatology. - Plan: CBC with Differential/Platelet  Muscle cramp  - Plan: Basic metabolic panel, TSH, T4, Free  F/u in 1 month, sooner if needed  Abbe Amsterdam, MD

## 2020-04-10 NOTE — Patient Instructions (Signed)
Preventive Care 21-27 Years Old, Male Preventive care refers to lifestyle choices and visits with your health care provider that can promote health and wellness. This includes:  A yearly physical exam. This is also called an annual wellness visit.  Regular dental and eye exams.  Immunizations.  Screening for certain conditions.  Healthy lifestyle choices, such as: ? Eating a healthy diet. ? Getting regular exercise. ? Not using drugs or products that contain nicotine and tobacco. ? Limiting alcohol use. What can I expect for my preventive care visit? Physical exam Your health care provider may check your:  Height and weight. These may be used to calculate your BMI (body mass index). BMI is a measurement that tells if you are at a healthy weight.  Heart rate and blood pressure.  Body temperature.  Skin for abnormal spots. Counseling Your health care provider may ask you questions about your:  Past medical problems.  Family's medical history.  Alcohol, tobacco, and drug use.  Emotional well-being.  Home life and relationship well-being.  Sexual activity.  Diet, exercise, and sleep habits.  Work and work environment.  Access to firearms. What immunizations do I need? Vaccines are usually given at various ages, according to a schedule. Your health care provider will recommend vaccines for you based on your age, medical history, and lifestyle or other factors, such as travel or where you work.   What tests do I need? Blood tests  Lipid and cholesterol levels. These may be checked every 5 years starting at age 20.  Hepatitis C test.  Hepatitis B test. Screening  Diabetes screening. This is done by checking your blood sugar (glucose) after you have not eaten for a while (fasting).  Genital exam to check for testicular cancer or hernias.  STD (sexually transmitted disease) testing, if you are at risk. Talk with your health care provider about your test results,  treatment options, and if necessary, the need for more tests.   Follow these instructions at home: Eating and drinking  Eat a healthy diet that includes fresh fruits and vegetables, whole grains, lean protein, and low-fat dairy products.  Drink enough fluid to keep your urine pale yellow.  Take vitamin and mineral supplements as recommended by your health care provider.  Do not drink alcohol if your health care provider tells you not to drink.  If you drink alcohol: ? Limit how much you have to 0-2 drinks a day. ? Be aware of how much alcohol is in your drink. In the [place] one drink equals one 12 oz bottle of beer (355 mL), one 5 oz glass of wine (148 mL), or one 1 oz glass of hard liquor (44 mL).   Lifestyle  Take daily care of your teeth and gums. Brush your teeth every morning and night with fluoride toothpaste. Floss one time each day.  Stay active. Exercise for at least 30 minutes 5 or more days each week.  Do not use any products that contain nicotine or tobacco, such as cigarettes, e-cigarettes, and chewing tobacco. If you need help quitting, ask your health care provider.  Do not use drugs.  If you are sexually active, practice safe sex. Use a condom or other form of protection to prevent STIs (sexually transmitted infections).  Find healthy ways to cope with stress, such as: ? Meditation, yoga, or listening to music. ? Journaling. ? Talking to a trusted person. ? Spending time with friends and family. Safety  Always wear your seat belt while driving   or riding in a vehicle.  Do not drive: ? If you have been drinking alcohol. Do not ride with someone who has been drinking. ? When you are tired or distracted. ? While texting.  Wear a helmet and other protective equipment during sports activities.  If you have firearms in your house, make sure you follow all gun safety procedures.  Seek help if you have been physically or sexually abused. What's next?  Go to your  health care provider once a year for an annual wellness visit.  Ask your health care provider how often you should have your eyes and teeth checked.  Stay up to date on all vaccines. This information is not intended to replace advice given to you by your health care provider. Make sure you discuss any questions you have with your health care provider. Document Revised: 11/04/2018 Document Reviewed: 02/12/2018 Elsevier Patient Education  2021 [employer] Inc[employer]  Food Choices for Gastroesophageal Reflux Disease, Adult When you have gastroesophageal reflux disease (GERD), the foods you eat and your eating habits are very important. Choosing the right foods can help ease your discomfort. Think about working with a food expert (dietitian) to help you make good choices. What are tips for following this plan? Reading food labels  Look for foods that are low in saturated fat. Foods that may help with your symptoms include: ? Foods that have less than 5% of daily value (DV) of fat. ? Foods that have 0 grams of trans fat. Cooking  Do not fry your food.  Cook your food by baking, steaming, grilling, or broiling. These are all methods that do not need a lot of fat for cooking.  To add flavor, try to use herbs that are low in spice and acidity. Meal planning  Choose healthy foods that are low in fat, such as: ? Fruits and vegetables. ? Whole grains. ? Low-fat dairy products. ? Lean meats, fish, and poultry.  Eat small meals often instead of eating 3 large meals each day. Eat your meals slowly in a place where you are relaxed. Avoid bending over or lying down until 2-3 hours after eating.  Limit high-fat foods such as fatty meats or fried foods.  Limit your intake of fatty foods, such as oils, butter, and shortening.  Avoid the following as told by your doctor: ? Foods that cause symptoms. These may be different for different people. Keep a food diary to keep track of foods that cause  symptoms. ? Alcohol. ? Drinking a lot of liquid with meals. ? Eating meals during the 2-3 hours before bed.   Lifestyle  Stay at a healthy weight. Ask your doctor what weight is healthy for you. If you need to lose weight, work with your doctor to do so safely.  Exercise for at least 30 minutes on 5 or more days each week, or as told by your doctor.  Wear loose-fitting clothes.  Do not smoke or use any products that contain nicotine or tobacco. If you need help quitting, ask your doctor.  Sleep with the head of your bed higher than your feet. Use a wedge under the mattress or blocks under the bed frame to raise the head of the bed.  Chew sugar-free gum after meals. What foods should eat? Eat a healthy, well-balanced diet of fruits, vegetables, whole grains, low-fat dairy products, lean meats, fish, and poultry. Each person is different. Foods that may cause symptoms in one person may not cause any symptoms in another person.  Work with your doctor to find foods that are safe for you. The items listed above may not be a complete list of what you can eat and drink. Contact a food expert for more options.   What foods should I avoid? Limiting some of these foods may help in managing the symptoms of GERD. Everyone is different. Talk with a food expert or your doctor to help you find the exact foods to avoid, if any. Fruits Any fruits prepared with added fat. Any fruits that cause symptoms. For some people, this may include citrus fruits, such as oranges, grapefruit, pineapple, and lemons. Vegetables Deep-fried vegetables. [place] fries. Any vegetables prepared with added fat. Any vegetables that cause symptoms. For some people, this may include tomatoes and tomato products, chili peppers, onions and garlic, and horseradish. Grains Pastries or quick breads with added fat. Meats and other proteins High-fat meats, such as fatty beef or pork, hot dogs, ribs, ham, sausage, salami, and bacon. Fried  meat or protein, including fried fish and fried chicken. Nuts and nut butters, in large amounts. Dairy Whole milk and chocolate milk. Sour cream. Cream. Ice cream. Cream cheese. Milkshakes. Fats and oils Butter. Margarine. Shortening. Ghee. Beverages Coffee and tea, with or without caffeine. Carbonated beverages. Sodas. Energy drinks. Fruit juice made with acidic fruits, such as orange or grapefruit. Tomato juice. Alcoholic drinks. Sweets and desserts Chocolate and cocoa. Donuts. Seasonings and condiments Pepper. Peppermint and spearmint. Added salt. Any condiments, herbs, or seasonings that cause symptoms. For some people, this may include curry, hot sauce, or vinegar-based salad dressings. The items listed above may not be a complete list of what you should not eat and drink. Contact a food expert for more options. Questions to ask your doctor Diet and lifestyle changes are often the first steps that are taken to manage symptoms of GERD. If diet and lifestyle changes do not help, talk with your doctor about taking medicines. Where to find more information  International [employer] for Gastrointestinal Disorders: aboutgerd.org Summary  When you have GERD, food and lifestyle choices are very important in easing your symptoms.  Eat small meals often instead of 3 large meals a day. Eat your meals slowly and in a place where you are relaxed.  Avoid bending over or lying down until 2-3 hours after eating.  Limit high-fat foods such as fatty meats or fried foods. This information is not intended to replace advice given to you by your health care provider. Make sure you discuss any questions you have with your health care provider. Document Revised: 08/30/2019 Document Reviewed: 08/30/2019 Elsevier Patient Education  2021 [employer] Inc.  Gastroesophageal Reflux Disease, Adult  Gastroesophageal reflux (GER) happens when acid from the stomach flows up into the tube that connects the mouth and  the stomach (esophagus). Normally, food travels down the esophagus and stays in the stomach to be digested. With GER, food and stomach acid sometimes move back up into the esophagus. You may have a disease called gastroesophageal reflux disease (GERD) if the reflux:  Happens often.  Causes frequent or very bad symptoms.  Causes problems such as damage to the esophagus. When this happens, the esophagus becomes sore and swollen. Over time, GERD can make small holes (ulcers) in the lining of the esophagus. What are the causes? This condition is caused by a problem with the muscle between the esophagus and the stomach. When this muscle is weak or not normal, it does not close properly to keep food and acid from  coming back up from the stomach. The muscle can be weak because of:  Tobacco use.  Pregnancy.  Having a certain type of hernia (hiatal hernia).  Alcohol use.  Certain foods and drinks, such as coffee, chocolate, onions, and peppermint. What increases the risk?  Being overweight.  Having a disease that affects your connective tissue.  Taking NSAIDs, such a ibuprofen. What are the signs or symptoms?  Heartburn.  Difficult or painful swallowing.  The feeling of having a lump in the throat.  A bitter taste in the mouth.  Bad breath.  Having a lot of saliva.  Having an upset or bloated stomach.  Burping.  Chest pain. Different conditions can cause chest pain. Make sure you see your doctor if you have chest pain.  Shortness of breath or wheezing.  A long-term cough or a cough at night.  Wearing away of the surface of teeth (tooth enamel).  Weight loss. How is this treated?  Making changes to your diet.  Taking medicine.  Having surgery. Treatment will depend on how bad your symptoms are. Follow these instructions at home: Eating and drinking  Follow a diet as told by your doctor. You may need to avoid foods and drinks such as: ? Coffee and tea, with or  without caffeine. ? Drinks that contain alcohol. ? Energy drinks and sports drinks. ? Bubbly (carbonated) drinks or sodas. ? Chocolate and cocoa. ? Peppermint and mint flavorings. ? Garlic and onions. ? Horseradish. ? Spicy and acidic foods. These include peppers, chili powder, curry powder, vinegar, hot sauces, and BBQ sauce. ? Citrus fruit juices and citrus fruits, such as oranges, lemons, and limes. ? Tomato-based foods. These include red sauce, chili, salsa, and pizza with red sauce. ? Fried and fatty foods. These include donuts, french fries, potato chips, and high-fat dressings. ? High-fat meats. These include hot dogs, rib eye steak, sausage, ham, and bacon. ? High-fat dairy items, such as whole milk, butter, and cream cheese.  Eat small meals often. Avoid eating large meals.  Avoid drinking large amounts of liquid with your meals.  Avoid eating meals during the 2-3 hours before bedtime.  Avoid lying down right after you eat.  Do not exercise right after you eat.   Lifestyle  Do not smoke or use any products that contain nicotine or tobacco. If you need help quitting, ask your doctor.  Try to lower your stress. If you need help doing this, ask your doctor.  If you are overweight, lose an amount of weight that is healthy for you. Ask your doctor about a safe weight loss goal.   General instructions  Pay attention to any changes in your symptoms.  Take over-the-counter and prescription medicines only as told by your doctor.  Do not take aspirin, ibuprofen, or other NSAIDs unless your doctor says it is okay.  Wear loose clothes. Do not wear anything tight around your waist.  Raise (elevate) the head of your bed about 6 inches (15 cm). You may need to use a wedge to do this.  Avoid bending over if this makes your symptoms worse.  Keep all follow-up visits. Contact a doctor if:  You have new symptoms.  You lose weight and you do not know why.  You have trouble  swallowing or it hurts to swallow.  You have wheezing or a cough that keeps happening.  You have a hoarse voice.  Your symptoms do not get better with treatment. Get help right away if:  You  have sudden pain in your arms, neck, jaw, teeth, or back.  You suddenly feel sweaty, dizzy, or light-headed.  You have chest pain or shortness of breath.  You vomit and the vomit is green, yellow, or black, or it looks like blood or coffee grounds.  You faint.  Your poop (stool) is red, bloody, or black.  You cannot swallow, drink, or eat. These symptoms may represent a serious problem that is an emergency. Do not wait to see if the symptoms will go away. Get medical help right away. Call your local emergency services (911 in the [place] Do not drive yourself to the hospital. Summary  If a person has gastroesophageal reflux disease (GERD), food and stomach acid move back up into the esophagus and cause symptoms or problems such as damage to the esophagus.  Treatment will depend on how bad your symptoms are.  Follow a diet as told by your doctor.  Take all medicines only as told by your doctor. This information is not intended to replace advice given to you by your health care provider. Make sure you discuss any questions you have with your health care provider. Document Revised: 08/30/2019 Document Reviewed: 08/30/2019 Elsevier Patient Education  2021 Else[employer] Inc.  Muscle Cramps and Spasms Muscle cramps and spasms are when muscles tighten by themselves. They usually get better within minutes. Muscle cramps are painful. They are usually stronger and last longer than muscle spasms. Muscle spasms may or may not be painful. They can last a few seconds or much longer. Cramps and spasms can affect any muscle, but they occur most often in the calf muscles of the leg. They are usually not caused by a serious problem. In many cases, the cause is not known. Some common causes include:  Doing more  physical work or exercise than your body is ready for.  Using the muscles too much (overuse) by repeating certain movements too many times.  Staying in a certain position for a long time.  Playing a sport or doing an activity without preparing properly.  Using bad form or technique while playing a sport or doing an activity.  Not having enough water in your body (dehydration).  Injury.  Side effects of some medicines.  Low levels of the salts and minerals in your blood (electrolytes), such as low potassium or calcium. Follow these instructions at home: Managing pain and stiffness  Massage, stretch, and relax the muscle. Do this for many minutes at a time.  If told, put heat on tight or tense muscles as often as told by your doctor. Use the heat source that your doctor recommends, such as a moist heat pack or a heating pad. ? Place a towel between your skin and the heat source. ? Leave the heat on for 20-30 minutes. ? Remove the heat if your skin turns bright red. This is very important if you are not able to feel pain, heat, or cold. You may have a greater risk of getting burned.  If told, put ice on the affected area. This may help if you are sore or have pain after a cramp or spasm. ? Put ice in a plastic bag. ? Place a towel between your skin and the bag. ? Leave the ice on for 20 minutes, 2-3 times a day.  Try taking hot showers or baths to help relax tight muscles.      Eating and drinking  Drink enough fluid to keep your pee (urine) pale yellow.  Eat a  healthy diet to help ensure that your muscles work well. This should include: ? Fruits and vegetables. ? Lean protein. ? Whole grains. ? Low-fat or nonfat dairy products. General instructions  If you are having cramps often, avoid intense exercise for several days.  Take over-the-counter and prescription medicines only as told by your doctor.  Watch for any changes in your symptoms.  Keep all follow-up visits  as told by your doctor. This is important. Contact a doctor if:  Your cramps or spasms get worse or happen more often.  Your cramps or spasms do not get better with time. Summary  Muscle cramps and spasms are when muscles tighten by themselves. They usually get better within minutes.  Cramps and spasms occur most often in the calf muscles of the leg.  Massage, stretch, and relax the muscle. This may help the cramp or spasm go away.  Drink enough fluid to keep your pee (urine) pale yellow. This information is not intended to replace advice given to you by your health care provider. Make sure you discuss any questions you have with your health care provider. Document Revised: 07/14/2017 Document Reviewed: 07/14/2017 Elsevier Patient Education  2021 Elsevier Inc.  https://www.foothealthfacts.org/conditions/ganglion-cyst"> https://www.clinicalkey.com">  Ganglion Cyst  A ganglion cyst is a non-cancerous, fluid-filled lump of tissue that occurs near a joint, tendon, or ligament. The cyst grows out of a joint or the lining of a tendon or ligament. Ganglion cysts most often develop in the hand or wrist, but they can also develop in the shoulder, elbow, hip, knee, ankle, or foot. Ganglion cysts are ball-shaped or egg-shaped. Their size can range from the size of a pea to larger than a grape. Increased activity may cause the cyst to get bigger because more fluid starts to build up. What are the causes? The exact cause of this condition is not known, but it may be related to:  Inflammation or irritation around the joint.  An injury or tear in the layers of tissue around the joint (joint capsule).  Repetitive movements or overuse.  History of acute or repeated injury. What increases the risk? You are more likely to develop this condition if:  You are a male.  You are 53-55 years old. What are the signs or symptoms? The main symptom of this condition is a lump. It most often appears on  the hand or wrist. In many cases, there are no other symptoms, but a cyst can sometimes cause:  Tingling.  Pain or tenderness.  Numbness.  Weakness or loss of strength in the affected joint.  Decreased range of motion in the affected area of the body.   How is this diagnosed? Ganglion cysts are usually diagnosed based on a physical exam. Your health care provider will feel the lump and may shine a light next to it. If it is a ganglion cyst, the light will likely shine through it. Your health care provider may order an X-ray, ultrasound, MRI, or CT scan to rule out other conditions. How is this treated? Ganglion cysts often go away on their own without treatment. If you have pain or other symptoms, treatment may be needed. Treatment is also needed if the ganglion cyst limits your movement or if it gets infected. Treatment may include:  Wearing a brace or splint on your wrist or finger.  Taking anti-inflammatory medicine.  Having fluid drained from the lump with a needle (aspiration).  Getting an injection of medicine into the joint to decrease inflammation. This may be  corticosteroids, ethanol, or hyaluronidase.  Having surgery to remove the ganglion cyst.  Placing a pad in your shoe or wearing shoes that will not rub against the cyst if it is on your foot. Follow these instructions at home:  Do not press on the ganglion cyst, poke it with a needle, or hit it.  Take over-the-counter and prescription medicines only as told by your health care provider.  If you have a brace or splint: ? Wear it as told by your health care provider. ? Remove it as told by your health care provider. Ask if you need to remove it when you take a shower or a bath.  Watch your ganglion cyst for any changes.  Keep all follow-up visits as told by your health care provider. This is important. Contact a health care provider if:  Your ganglion cyst becomes larger or more painful.  You have pus coming  from the lump.  You have weakness or numbness in the affected area.  You have a fever or chills. Get help right away if:  You have a fever and have any of these in the cyst area: ? Increased redness. ? Red streaks. ? Swelling. Summary  A ganglion cyst is a non-cancerous, fluid-filled lump that occurs near a joint, tendon, or ligament.  Ganglion cysts most often develop in the hand or wrist, but they can also develop in the shoulder, elbow, hip, knee, ankle, or foot.  Ganglion cysts often go away on their own without treatment. This information is not intended to replace advice given to you by your health care provider. Make sure you discuss any questions you have with your health care provider. Document Revised: 05/12/2019 Document Reviewed: 05/12/2019 Elsevier Patient Education  2021 [employer].

## 2020-04-21 MED FILL — OMEPRAZOLE 20 MG CAP: 20 | 30 days supply | Qty: 30 | Fill #0

## 2020-05-04 ENCOUNTER — Ambulatory Visit: Payer: No Typology Code available for payment source | Admitting: Family Medicine

## 2020-07-18 ENCOUNTER — Other Ambulatory Visit (HOSPITAL_COMMUNITY): Payer: Self-pay

## 2020-07-18 MED ORDER — DOXYCYCLINE HYCLATE 20 MG PO TABS
ORAL_TABLET | ORAL | 5 refills | Status: AC
  Filled 2020-07-18: qty 60, 30d supply, fill #0

## 2020-07-18 MED ORDER — CHLORHEXIDINE GLUCONATE 0.12 % MT SOLN
OROMUCOSAL | 2 refills | Status: AC
  Filled 2020-07-18: qty 473, 16d supply, fill #0

## 2020-07-26 ENCOUNTER — Other Ambulatory Visit (HOSPITAL_COMMUNITY): Payer: Self-pay

## 2022-07-02 ENCOUNTER — Other Ambulatory Visit (HOSPITAL_BASED_OUTPATIENT_CLINIC_OR_DEPARTMENT_OTHER): Payer: Self-pay

## 2023-03-07 ENCOUNTER — Telehealth: Payer: Self-pay | Admitting: Family Medicine

## 2023-03-07 NOTE — Telephone Encounter (Signed)
 Last visit:  04/10/20 Called Pt to schedule CPE.  LVM for Pt to call us back to schedule.
